# Patient Record
Sex: Female | Born: 1950 | Race: White | Hispanic: No | Marital: Single | Smoking: Former smoker
Health system: Southern US, Community
[De-identification: ages and names within clinical notes are randomized; demographics above are authoritative.]

## PROBLEM LIST (undated history)

## (undated) DIAGNOSIS — F32A Depression, unspecified: Secondary | ICD-10-CM

## (undated) DIAGNOSIS — F329 Major depressive disorder, single episode, unspecified: Secondary | ICD-10-CM

## (undated) HISTORY — DX: Major depressive disorder, single episode, unspecified: F32.9

## (undated) HISTORY — DX: Depression, unspecified: F32.A

---

## 2015-03-31 ENCOUNTER — Ambulatory Visit
Admit: 2015-03-31 | Disposition: A | Discharge status: Home / Self Care | Payer: Self-pay | Attending: Neurology | Admitting: Neurology

## 2015-04-18 DIAGNOSIS — G3183 Dementia with Lewy bodies: Secondary | ICD-10-CM

## 2015-04-18 DIAGNOSIS — F0280 Dementia in other diseases classified elsewhere without behavioral disturbance: Secondary | ICD-10-CM | POA: Insufficient documentation

## 2015-06-01 ENCOUNTER — Encounter: Payer: Self-pay | Admitting: Psychiatry

## 2015-06-01 ENCOUNTER — Other Ambulatory Visit: Payer: Self-pay

## 2015-06-01 ENCOUNTER — Ambulatory Visit (INDEPENDENT_AMBULATORY_CARE_PROVIDER_SITE_OTHER): Payer: Medicare Other | Admitting: Psychiatry

## 2015-06-01 ENCOUNTER — Telehealth: Payer: Self-pay

## 2015-06-01 VITALS — BP 118/82 | HR 64 | Temp 97.6°F | Ht 64.0 in

## 2015-06-01 DIAGNOSIS — F4001 Agoraphobia with panic disorder: Secondary | ICD-10-CM | POA: Diagnosis not present

## 2015-06-01 DIAGNOSIS — G3183 Dementia with Lewy bodies: Secondary | ICD-10-CM | POA: Diagnosis not present

## 2015-06-01 DIAGNOSIS — F331 Major depressive disorder, recurrent, moderate: Secondary | ICD-10-CM

## 2015-06-01 DIAGNOSIS — Z8619 Personal history of other infectious and parasitic diseases: Secondary | ICD-10-CM | POA: Insufficient documentation

## 2015-06-01 DIAGNOSIS — F0280 Dementia in other diseases classified elsewhere without behavioral disturbance: Secondary | ICD-10-CM | POA: Diagnosis not present

## 2015-06-01 MED ORDER — DONEPEZIL HCL 10 MG PO TABS: 10.0000 mg | ORAL_TABLET | Freq: Every day | ORAL | Status: AC

## 2015-06-01 MED ORDER — LITHIUM CARBONATE ER 300 MG PO TBCR
300.0000 mg | EXTENDED_RELEASE_TABLET | Freq: Every day | ORAL | Status: AC
Start: 2015-06-01 — End: ?

## 2015-06-01 MED ORDER — QUETIAPINE FUMARATE 25 MG PO TABS: 25.0000 mg | ORAL_TABLET | Freq: Every day | ORAL | Status: AC

## 2015-06-01 MED ORDER — LORAZEPAM 0.5 MG PO TABS: 0.5000 mg | ORAL_TABLET | Freq: Every evening | ORAL | Status: AC | PRN

## 2015-06-01 MED ORDER — LITHIUM CARBONATE ER 300 MG PO TBCR: 300.0000 mg | EXTENDED_RELEASE_TABLET | Freq: Two times a day (BID) | ORAL | Status: DC

## 2015-06-01 NOTE — Progress Notes (Signed)
BH MD/PA/NP OP Progress Note  06/01/2015 10:06 AM Tina Hodges  MRN:  [id number]  Subjective:  Patient presented for follow-up appointment accompanied by her sister. She reported that she owing to relocate to [facility] on June 19 and is excited about the same. She reported that she has been looking forward for her move as she was planning for the same for many months. Patient reported that she'll be living with her daughter and with her grandchildren. She has already resigned from her job. Patient reported that she continues to feel depressed but she does not know the reason for the same. She does not know if Aricept has helped her but she feels that her memory is now improving. She reported that she is in a state of constant depression which is not improving. She continues to be anxious about going to [place] and is not sleeping well. Her sister is however very supportive and reported that she has started improving on the current combination of medications. Patient reported that she is only taking lorazepam on a when necessary basis. She currently denied having any suicidal ideations or plans. She denied having any mood swings and anger anxiety or paranoia.   Chief Complaint:  Visit Diagnosis:     ICD-9-CM ICD-10-CM   1. Panic disorder with agoraphobia 300.21 F40.01   2. Dementia with Lewy bodies 331.82 G31.83     F02.80   3. MDD (major depressive disorder), recurrent episode, moderate 296.32 F33.1     Past Medical History: No past medical history on file. No past surgical history on file. Family History: No family history on file. Social History:  History   Social History  . Marital Status: Single    Spouse Name: N/A  . Number of Children: N/A  . Years of Education: N/A   Social History Main Topics  . Smoking status: Not on file  . Smokeless tobacco: Not on file  . Alcohol Use: Not on file  . Drug Use: Not on file  . Sexual Activity: Not on file   Other Topics Concern  .  Not on file   Social History Narrative  . No narrative on file   Additional History:  Moving to [place] in 10 days.    Assessment:   Musculoskeletal: Strength & Muscle Tone: within normal limits Gait & Station: normal Patient leans: N/A  Psychiatric Specialty Exam: HPI  Review of Systems  Constitutional: Positive for malaise/fatigue. Negative for weight loss.  HENT: Negative for congestion and tinnitus.   Respiratory: Negative for cough and shortness of breath.   Gastrointestinal: Positive for abdominal pain. Negative for diarrhea and constipation.  Genitourinary: Negative for frequency.  Musculoskeletal: Negative for back pain and neck pain.  Neurological: Positive for tremors. Negative for focal weakness.  Endo/Heme/Allergies: Negative for environmental allergies.  Psychiatric/Behavioral: Positive for depression. The patient is nervous/anxious.     There were no vitals taken for this visit.There is no height or weight on file to calculate BMI.  General Appearance: Casual  Eye Contact:  Fair  Speech:  Slow  Volume:  Decreased  Mood:  Anxious and Depressed  Affect:  Congruent  Thought Process:  Circumstantial  Orientation:  Full (Time, Place, and Person)  Thought Content:  Obsessions  Suicidal Thoughts:  No  Homicidal Thoughts:  No  Memory:  Immediate;   Fair  Judgement:  Fair  Insight:  Fair  Psychomotor Activity:  Decreased  Concentration:  Fair  Recall:  [employer] of Knowledge: Fair  Language: Fair  Akathisia:  No  Handed:  Right  AIMS (if indicated):  none  Assets:  Communication Skills Desire for Improvement Social Support  ADL's:  Intact  Cognition: WNL  Sleep:  6   Is the patient at risk to self?  No. Has the patient been a risk to self in the past 6 months?  No. Has the patient been a risk to self within the distant past?  No. Is the patient a risk to others?  No. Has the patient been a risk to others in the past 6 months?  No. Has the  patient been a risk to others within the distant past?  No.  Current Medications: Current Outpatient Prescriptions  Medication Sig Dispense Refill  . donepezil (ARICEPT) 5 MG tablet Take 5 mg by mouth at bedtime.  1  . lithium carbonate (LITHOBID) 300 MG CR tablet TAKE 2 TABLETS BY MOUTH ONCE DAILY AT BEDTIME  1  . LORazepam (ATIVAN) 0.5 MG tablet TAKE 1/2 TABLET BY MOUTH ONCE DAILY AS NEEDED  1  . QUEtiapine (SEROQUEL) 25 MG tablet Take 25 mg by mouth at bedtime.  1   No current facility-administered medications for this visit.    Medical Decision Making:  Established Problem, Stable/Improving (1)  Treatment Plan Summary:Medication management  Discussed with patient about the medications and treatment benefits at length. I will titrate the dose of Aricept to 10 mg by mouth daily. She will continue on other medications as prescribed. She takes Viibryd 20 mg daily and was given samples of the medication.  she also takes lithium 600 mg at bedtime for severe depression and has responded well to the medication She takes lorazepam on a when necessary basis and patient was advised about taking lorazepam but she is insistent on continuing the medication She is started on Aricept recently and I will titrate the dose after her diagnosis of dementia with Lewy body She will also continue on Seroquel 25 mg at bedtime And reported that she has appointment with a neurologist at the [facility] in July and she will continue her treatment over there  No follow-up appointments will be made at this time.    SSRI/ SNRI/ Antidepressants Discussed with pt about the [employer], increased risk of suicidal thinking when starting the medications.  GI side effects, sexual side effects, increase in manic or hypomanic symptoms as well as the discontinuation syndromes.Advised about withdrawal symptoms if stopped immediately. Pt demonstrated understanding.     Atypicals: The patient was counseled on the  risks, benefits, and alternatives to treatment with an atypical antipsychotic agent.  Risks discussed include metabolic side effects: weight gain, elevations in blood sugar and lipids, and increased risk of diabetes.  The patient was also advised of the risks of dystonia, akathisia, parkinsonism, tardive dyskinesia, and prolactin elevation.  The patient was able to demonstrate understanding of these risks and provided informed consent to start the medication.    LITHIUM Discussed with patient about the adverse effects of Lithium including cardiovascular effects, thyroid dysfunction, and renal effects.  It can be toxic to the fetus and medication needs to be monitored in nursing and lactating females.  Signs of LI toxicity include tremors, GI symptoms, T wave flattening. Pt agreed with the plan and demonstrated understanding.    BENZODIAZEPINES: Benzodiazepine medications may be habit forming.  If you feel the medication is not working as well, do not take more than the prescribed dose.  Taking too much of a benzodiazepine  medication may cause respiratory depression and death.  Always store medication safely and away from children.  Benzodiazepine medications can cause drowsiness.  Avoid driving, operating machinery, or doing anything dangerous if you are not alert.Do not drink alcohol when taking benzodiazepine medications.  This will increase the sedative effect, and could lead to alcohol toxicity and death.    More than 50% of the time spent in psychoeducation, counseling and coordination of care.    This note was generated in part or whole with voice recognition software. Voice regonition is usually quite accurate but there are transcription errors that can and very often do occur. I apologize for any typographical errors that were not detected and corrected.    Brandy Hale 06/01/2015, 10:06 AM

## 2015-06-01 NOTE — Telephone Encounter (Signed)
said that you sent two rx for lithium and they need to find out which one you want

## 2015-06-01 NOTE — Telephone Encounter (Signed)
She is prescribed Lithobid CR 300mg  BID - 90 days supply

## 2015-06-20 ENCOUNTER — Ambulatory Visit: Payer: Self-pay | Admitting: Psychiatry

## 2017-03-17 IMAGING — MR MRI HEAD WITHOUT AND WITH CONTRAST
11 of 12 series · 39 of 48 positions shown · IV contrast (multihance)
Comparison: None.

CLINICAL DATA: 63-year-old female with depression for the past
year. Memory loss, confusion, unsteady gait and difficulty with
adding and writing. No injury. Initial encounter.

EXAM:
MRI HEAD WITHOUT AND WITH CONTRAST
TECHNIQUE: Multiplanar, multiecho pulse sequences of the brain and surrounding
structures were obtained without and with intravenous contrast.
CONTRAST:  11 cc MultiHance.

[Series 2: T1 · sagittal · 5.0mm · 0.45mm/px · 1 of 27 slices shown]
[im 1/27]
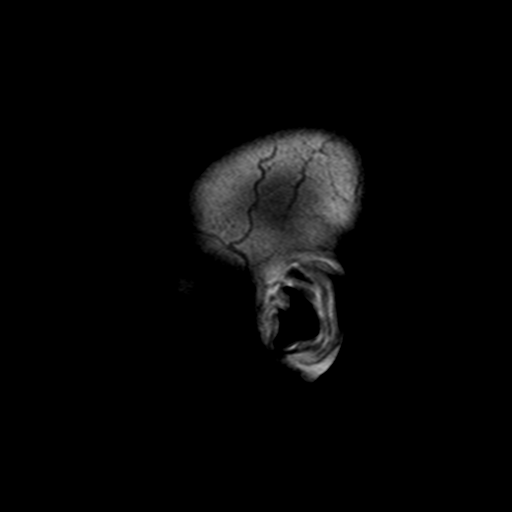

[Series 4: DWI · axial · 4.0mm · 1.80mm/px · z∈[-77,+99]mm · 5 of 45 slices shown (1 of 4)]
[im 1/45]
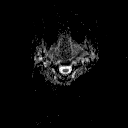
[im 12/45]
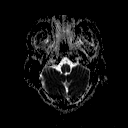
[im 23/45]
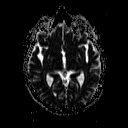
[im 34/45]
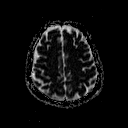
[im 45/45]
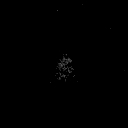

[Series 6: DWI · coronal · 5.0mm · 1.80mm/px · 4 of 39 slices shown (2 of 4)]
[im 1/39]
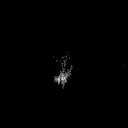
[im 13/39]
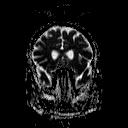
[im 26/39]
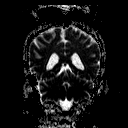
[im 39/39]
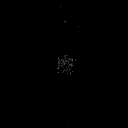

[Series 7: DWI · axial · 4.0mm · 1.80mm/px · z∈[-73,+95]mm · 4 of 41 slices shown (3 of 4)]
[im 1/41]
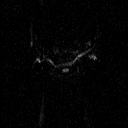
[im 14/41]
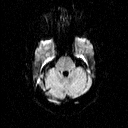
[im 27/41]
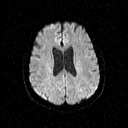
[im 41/41]
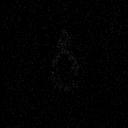

[Series 8: DWI · coronal · 5.0mm · 1.80mm/px · 4 of 37 slices shown (4 of 4)]
[im 1/37]
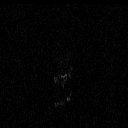
[im 13/37]
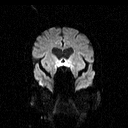
[im 25/37]
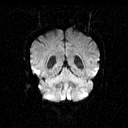
[im 37/37]
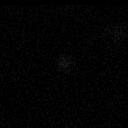

[Series 9: T2 · axial · 5.0mm · 0.45mm/px · z∈[-55,+101]mm · 3 of 25 slices shown (1 of 2)]
[im 1/25]
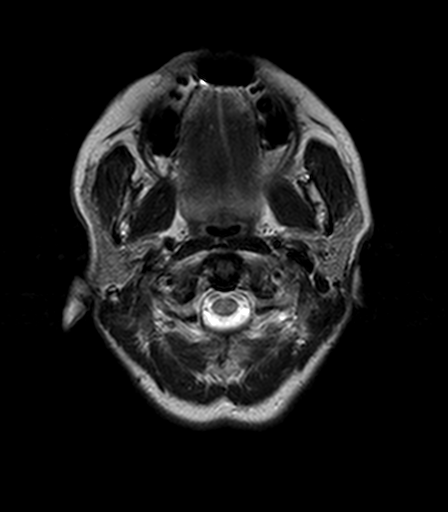
[im 13/25]
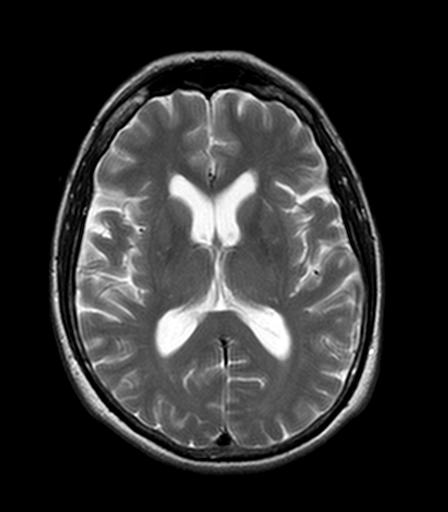
[im 25/25]
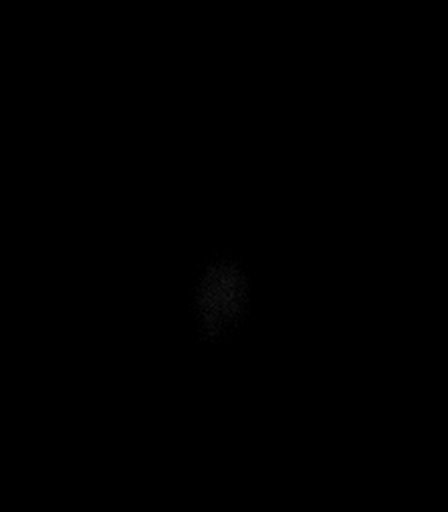

[Series 10: FLAIR · axial · 5.0mm · 0.90mm/px · z∈[-55,+101]mm · 3 of 25 slices shown]
[im 1/25]
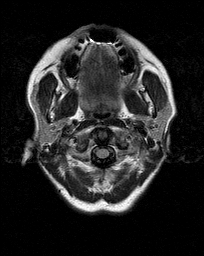
[im 13/25]
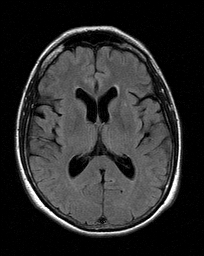
[im 25/25]
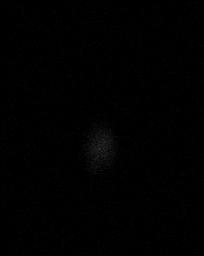

[Series 11: T2 · axial · 5.0mm · 0.45mm/px · z∈[-55,+101]mm · 3 of 25 slices shown (2 of 2)]
[im 1/25]
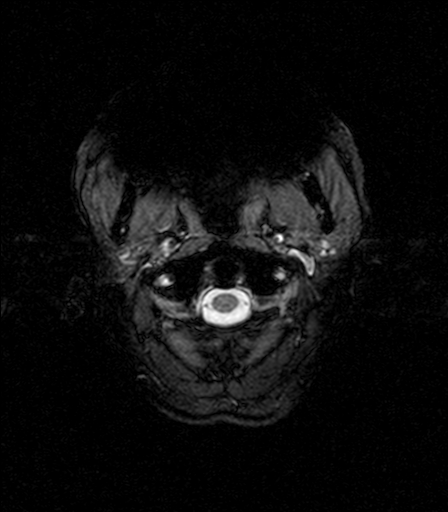
[im 13/25]
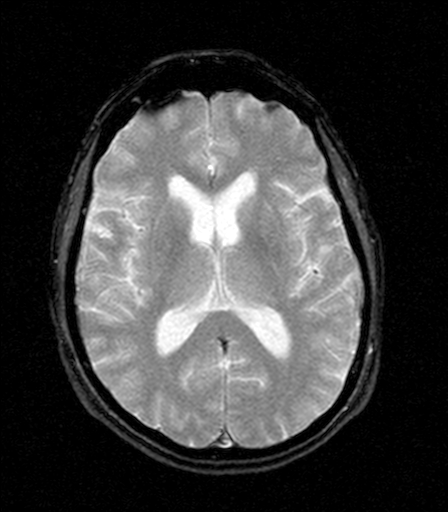
[im 25/25]
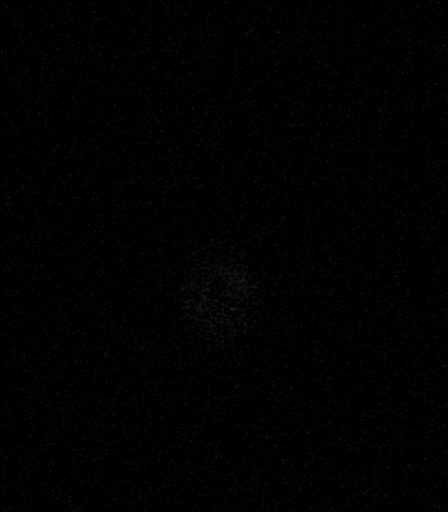

[Series 13: T2 post-contrast · coronal · 5.0mm · 0.45mm/px · 3 of 31 slices shown]
[im 1/31]
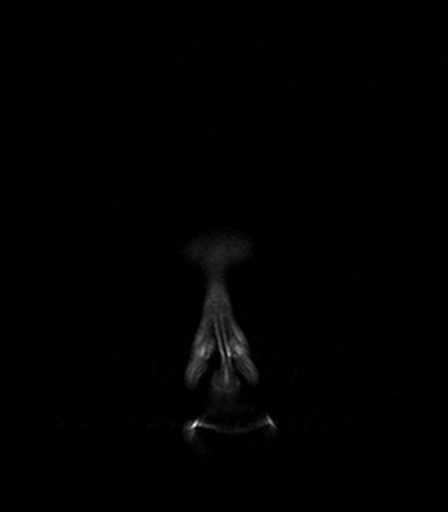
[im 16/31]
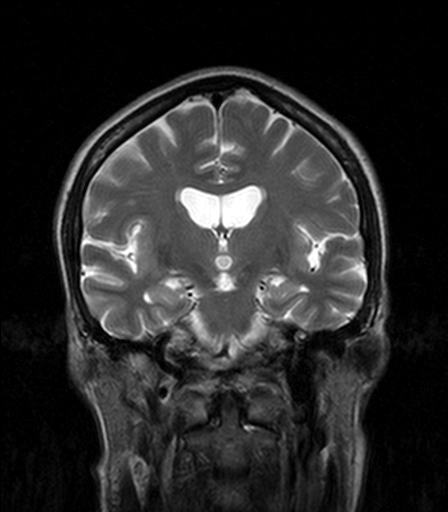
[im 31/31]
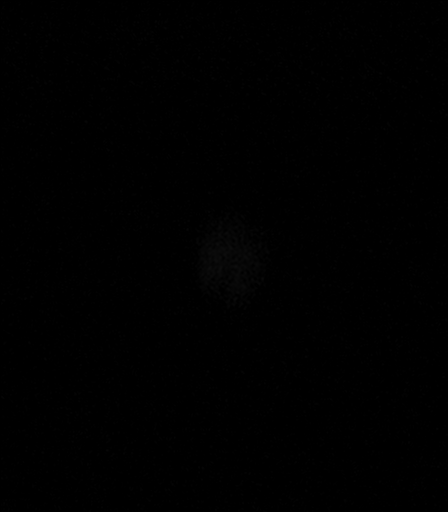

[Series 14: T1 post-contrast · axial · 3.0mm · 0.45mm/px · z∈[-59,+106]mm · 6 of 56 slices shown (1 of 2)]
[im 1/56]
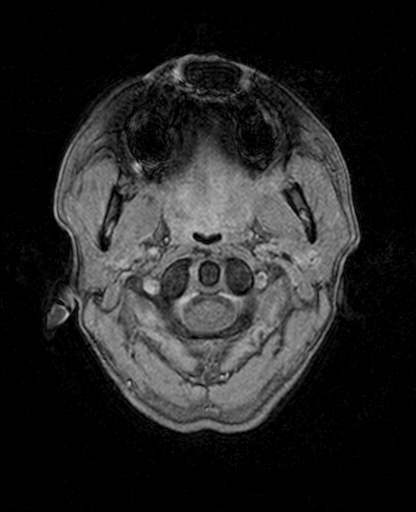
[im 12/56]
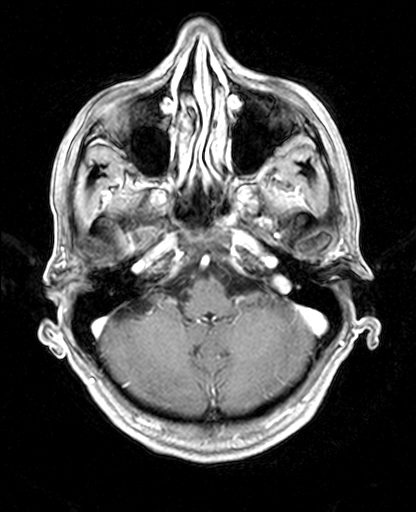
[im 23/56]
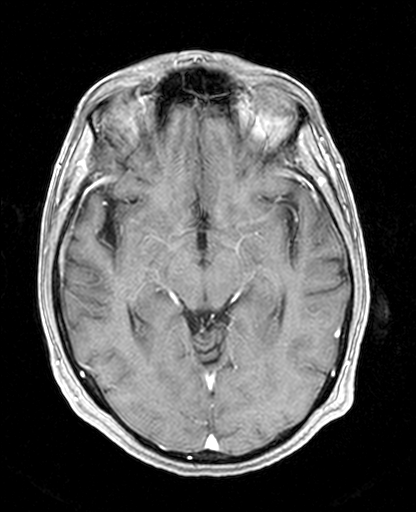
[im 34/56]
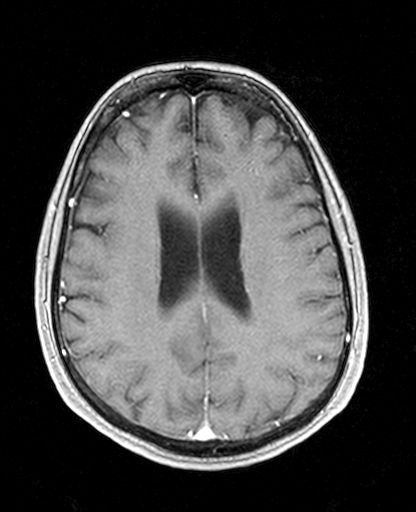
[im 45/56]
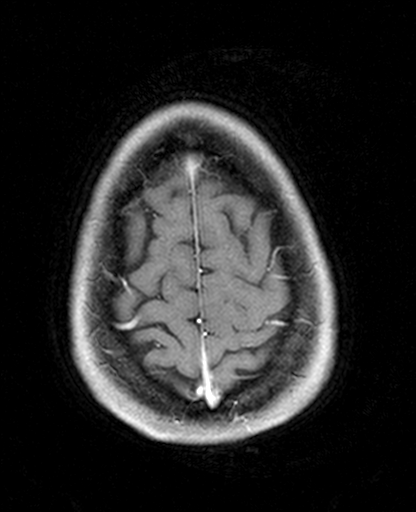
[im 56/56]
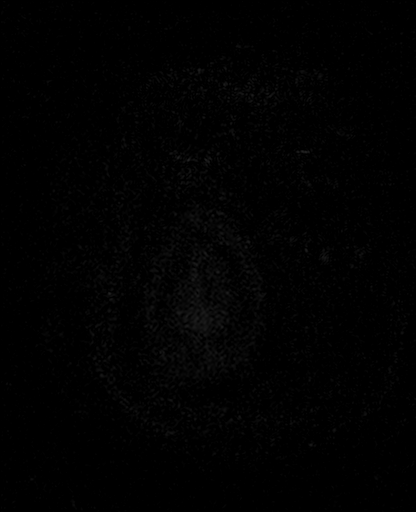

[Series 15: T1 post-contrast · coronal · 5.0mm · 0.45mm/px · 3 of 31 slices shown (2 of 2)]
[im 1/31]
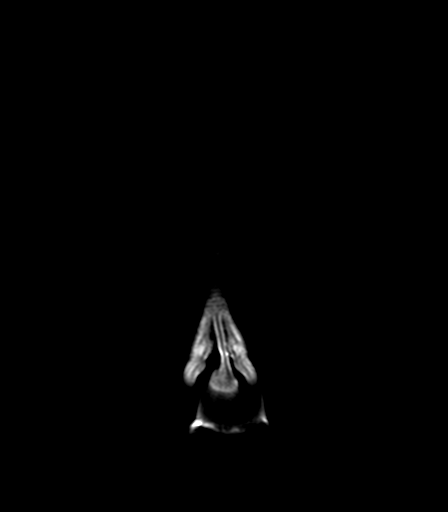
[im 16/31]
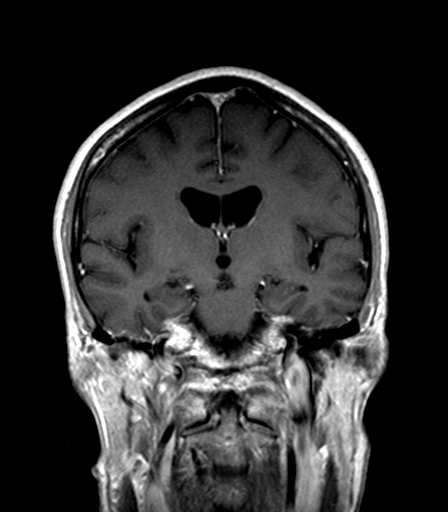
[im 31/31]
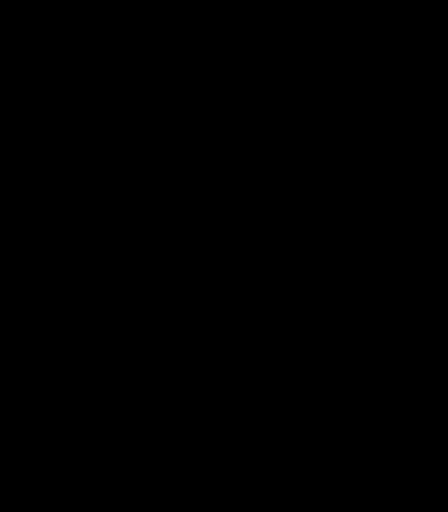

[39 of 48 positions shown; findings below may reference images not displayed]

FINDINGS: No acute infarct.

No intracranial hemorrhage.

Minimal white matter type changes probably related to result of
small vessel disease.

No hydrocephalus.

No intracranial mass or abnormal enhancement.

Mild cervical spondylotic changes C4-5 level with spinal stenosis
and minimal cord flattening.

Minimally prominent pineal gland without significant compression of
the superior colliculus.

Cervical medullary junction, pituitary region and orbital structures
unremarkable.

Major intracranial vascular structures are patent.

Minimal paranasal sinus mucosal thickening.
IMPRESSION: No acute infarct.

No intracranial mass or abnormal enhancement.

Minimal small vessel disease type changes.

Cervical spondylotic changes C4-5 with mild spinal stenosis and
minimal cord flattening.
